# Patient Record
Sex: Female | Born: 1985 | Race: White | Hispanic: No | Marital: Single | Smoking: Never smoker
Health system: Southern US, Community
[De-identification: ages and names within clinical notes are randomized; demographics above are authoritative.]

## PROBLEM LIST (undated history)

## (undated) DIAGNOSIS — F329 Major depressive disorder, single episode, unspecified: Secondary | ICD-10-CM

## (undated) DIAGNOSIS — T7840XA Allergy, unspecified, initial encounter: Secondary | ICD-10-CM

## (undated) DIAGNOSIS — F32A Depression, unspecified: Secondary | ICD-10-CM

## (undated) DIAGNOSIS — J45909 Unspecified asthma, uncomplicated: Secondary | ICD-10-CM

## (undated) DIAGNOSIS — F419 Anxiety disorder, unspecified: Secondary | ICD-10-CM

## (undated) DIAGNOSIS — F319 Bipolar disorder, unspecified: Secondary | ICD-10-CM

## (undated) HISTORY — DX: Major depressive disorder, single episode, unspecified: F32.9

## (undated) HISTORY — DX: Anxiety disorder, unspecified: F41.9

## (undated) HISTORY — DX: Allergy, unspecified, initial encounter: T78.40XA

## (undated) HISTORY — DX: Depression, unspecified: F32.A

## (undated) HISTORY — DX: Unspecified asthma, uncomplicated: J45.909

## (undated) HISTORY — DX: Bipolar disorder, unspecified: F31.9

## (undated) HISTORY — PX: APPENDECTOMY: SHX54

---
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
## 2005-09-12 ENCOUNTER — Encounter: Admission: RE | Admit: 2005-09-12 | Discharge: 2005-09-12 | Payer: Self-pay | Admitting: *Deleted

## 2007-08-24 ENCOUNTER — Inpatient Hospital Stay (HOSPITAL_COMMUNITY): Admission: EM | Admit: 2007-08-24 | Discharge: 2007-08-25 | Payer: Self-pay | Admitting: Emergency Medicine

## 2007-08-24 ENCOUNTER — Encounter (INDEPENDENT_AMBULATORY_CARE_PROVIDER_SITE_OTHER): Payer: Self-pay | Admitting: Surgery

## 2008-06-21 IMAGING — CT CT ABDOMEN W/ CM
3 of 5 series · 14 of 32 positions shown, 19 images · IV contrast (OMNI 300/WATER & 100 ML OMNI 300)
Comparison: none

HISTORY: Right lower quadrant pain, nausea

[Series 2: routine abdomen · axial · 0.79mm/px · z∈[-322,-182]mm · 2 of 85 slices shown]
[im 29/85  soft-tissue]
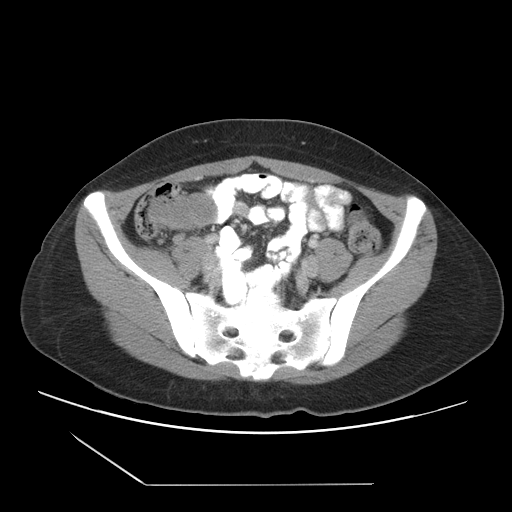
[im 57/85  soft-tissue]
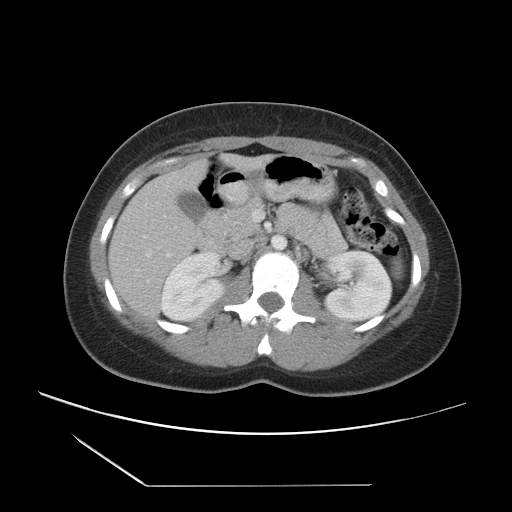

[Series 3: recon 2: routine abdomen · axial · 0.79mm/px · z∈[-396,-132]mm · 4 of 89 slices shown, 9 images]
[im 18/89  soft-tissue]
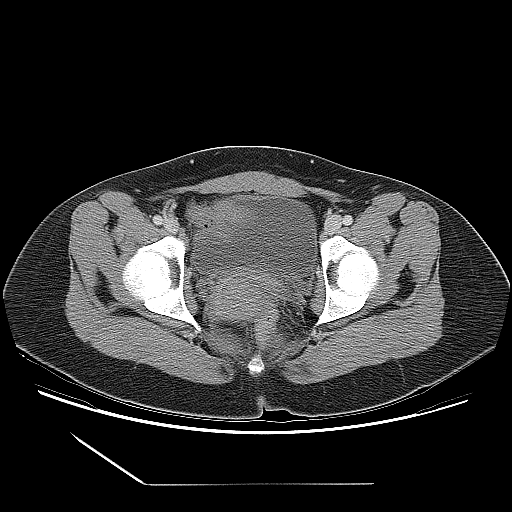
[im 18/89  lung]
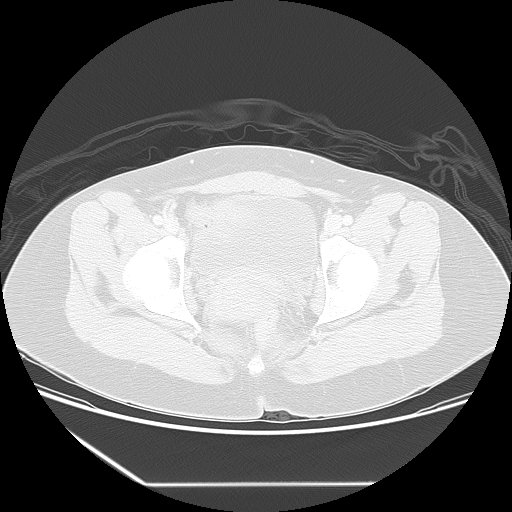
[im 18/89  bone]
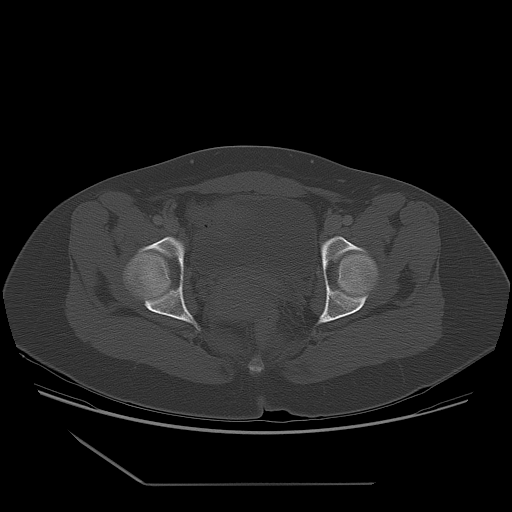
[im 36/89  soft-tissue]
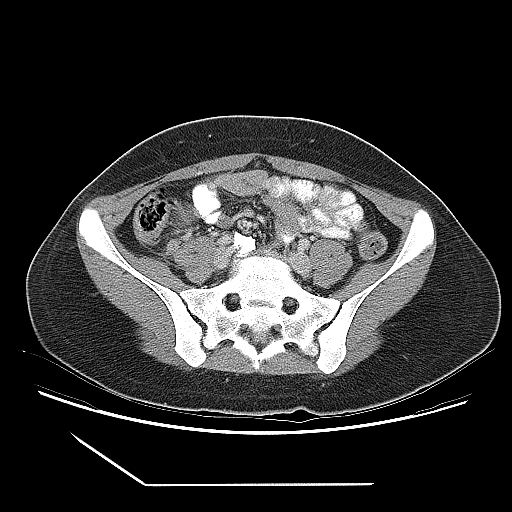
[im 36/89  lung]
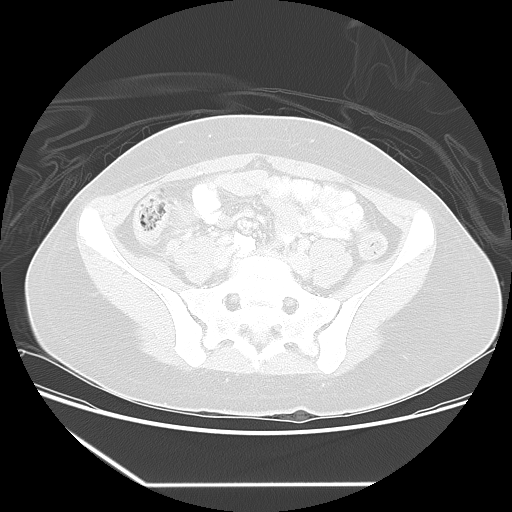
[im 53/89  soft-tissue]
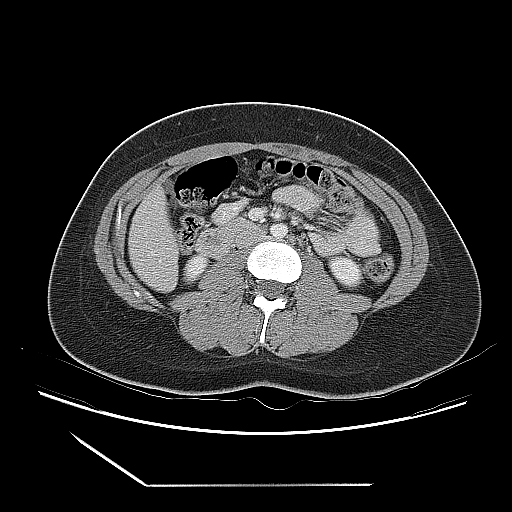
[im 53/89  lung]
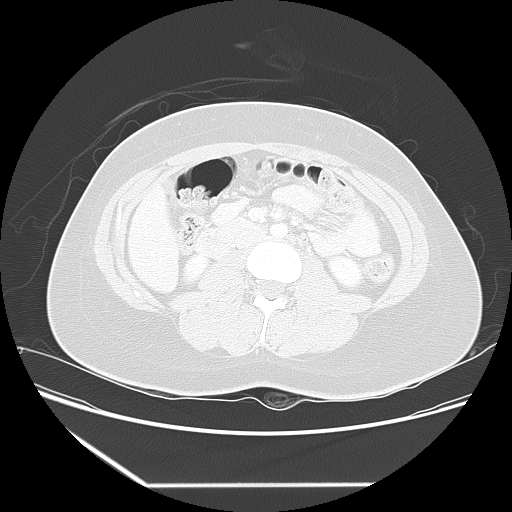
[im 71/89  soft-tissue]
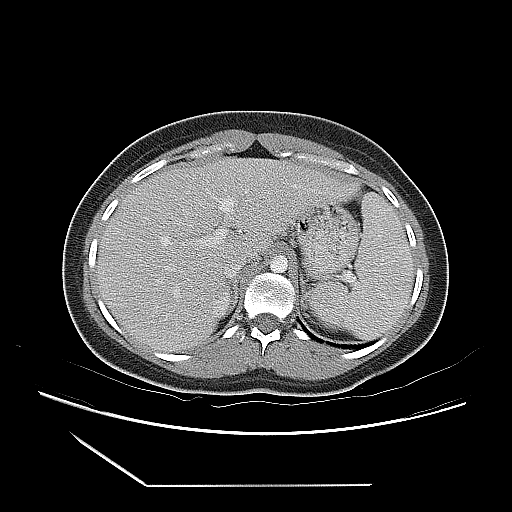
[im 71/89  lung]
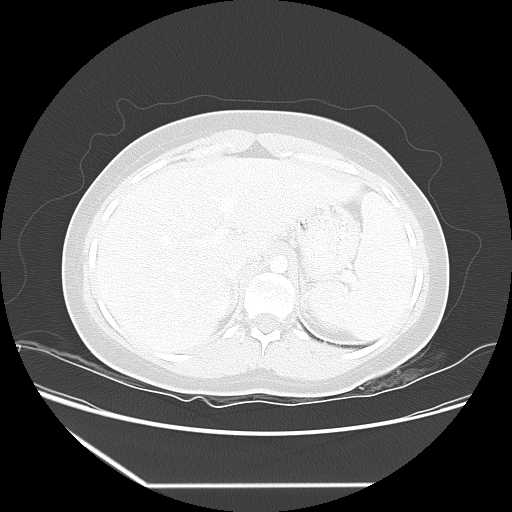

[Series 103: reformatted · sagittal · 0.88mm/px · 8 of 185 slices shown]
[im 17/185  soft-tissue]
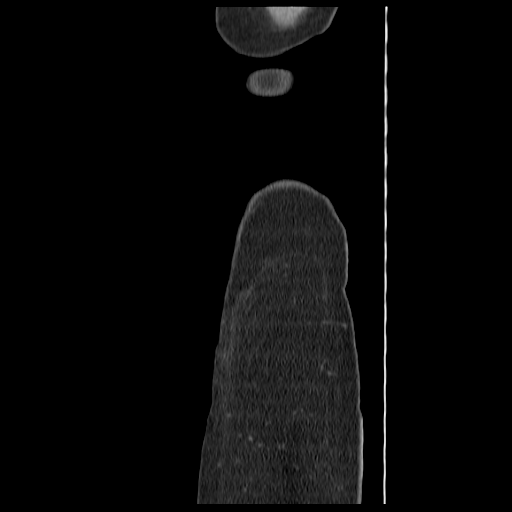
[im 34/185  soft-tissue]
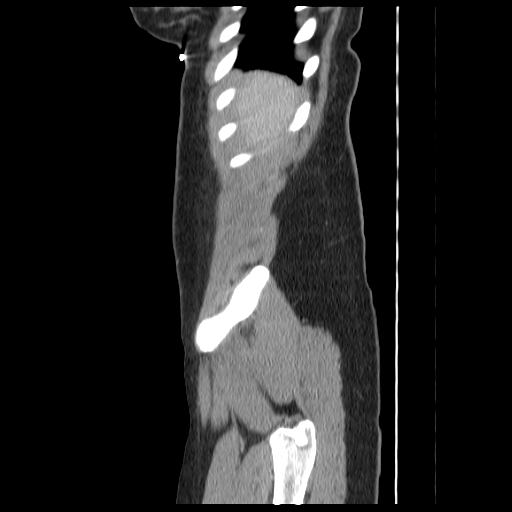
[im 67/185  soft-tissue]
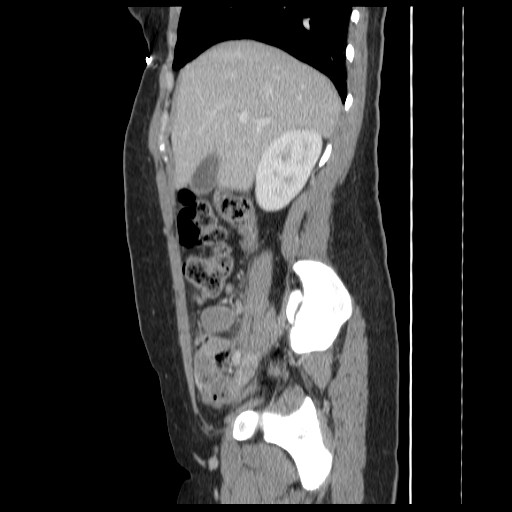
[im 84/185  soft-tissue]
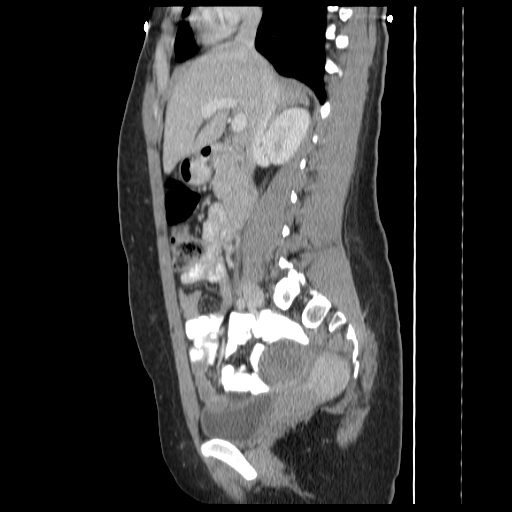
[im 101/185  soft-tissue]
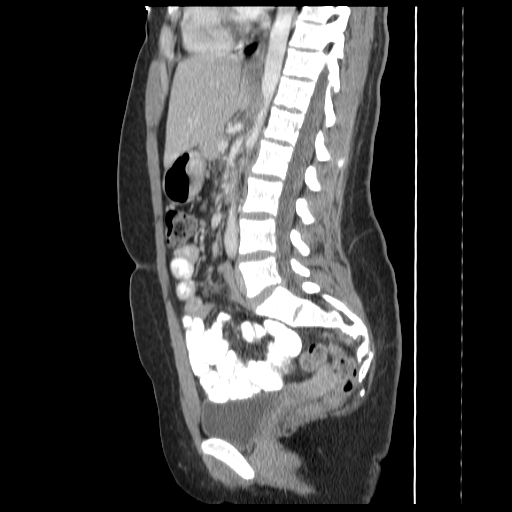
[im 118/185  soft-tissue]
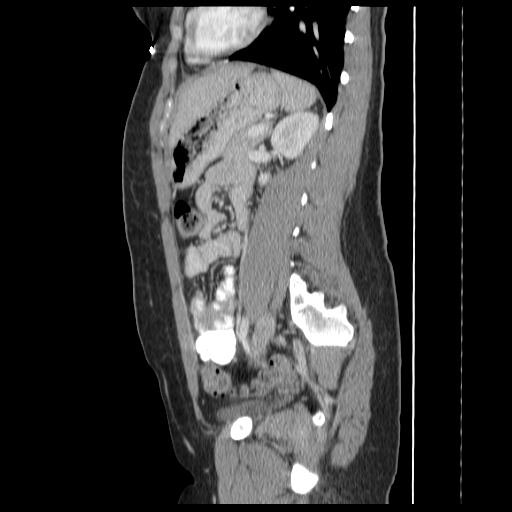
[im 151/185  soft-tissue]
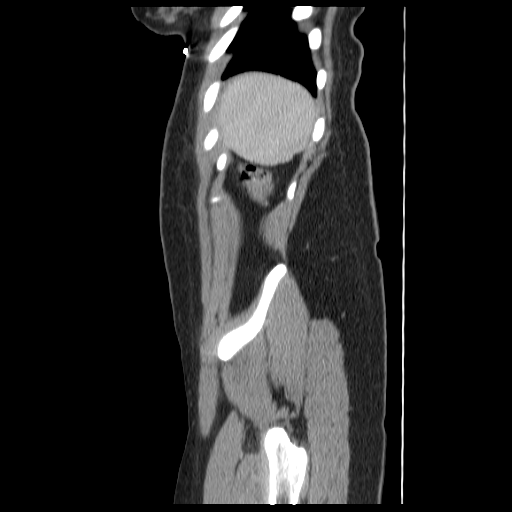
[im 168/185  soft-tissue]
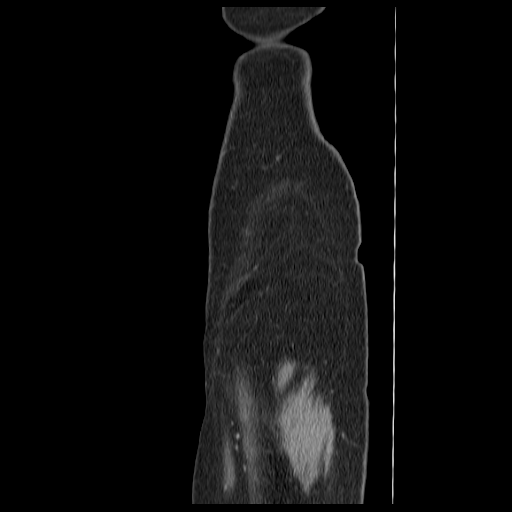

[14 of 32 positions shown; findings below may reference images not displayed]

CT ABDOMEN AND PELVIS WITH CONTRAST:

Multidetector helical CT imaging abdomen and pelvis performed.
Sagittal and coronal images are reconstructed from the axial data set.
Exam utilized dilute oral contrast and 100 cc [id number]-1II.
No prior exam for comparison. 

CT ABDOMEN:

Lung bases clear.
Liver, spleen, pancreas, kidneys, and adrenal glands normal.
Scattered normal size mesenteric nodes.
Borderline enlarged left para-aortic node 14 x 10 mm image 40.
No upper abdominal mass, free fluid, or inflammatory process.
IMPRESSION: Single nonspecific borderline enlarged left para-aortic lymph node.
No acute upper abdominal abnormalities, see below.

CT PELVIS:

Enlarged appendix with thickened wall and periappendiceal inflammatory changes.
Findings compatible with acute appendicitis.
Few adjacent reactive nodes within mesentery of right colon.
Additionally, 5.0 x 4.6 x 4.6 cm diameter cyst identified anterior to uterus,
likely from right ovary.
Left ovary normal size and morphology.
Unremarkable uterus and bladder.
Large and small bowel loops in pelvis otherwise normal.
No evidence of abscess, free fluid, or free air.
IMPRESSION: Acute appendicitis.
5 cm diameter right ovarian cyst, consider routine followup ultrasound of pelvis
when patient's condition permits, in order to characterize this lesion.

## 2009-04-27 ENCOUNTER — Emergency Department: Payer: Self-pay | Admitting: Emergency Medicine

## 2011-02-11 NOTE — Consult Note (Signed)
[name] [name], [name] [name]                 ACCOUNT NO.:  [id number]   MEDICAL RECORD NO.:  [id number]          PATIENT TYPE:  INP   LOCATION:  5731                         FACILITY:  [facility]   PHYSICIAN:  Maisie Fus A. Cornett, M.D.DATE OF BIRTH:  02/02/1986   DATE OF CONSULTATION:  08/24/2007  DATE OF DISCHARGE:                                 CONSULTATION   PHYSICIAN REQUESTING CONSULTATION:  Dr. Buren Kos.   REASON FOR CONSULTATION:  Abdominal pain.   HISTORY OF PRESENT ILLNESS:  The patient is a 25 year old female with a  1-day history of right lower quadrant pain.  The pain is sharp in nature  with radiation through to her back.  It is not associated with nausea or  vomiting.  It is made worse with movement.  The pain is a 10/10.  There  were no other associated symptoms such as blood in stool or diarrhea, no  history of viral illness exposure.  I was asked to see the patient at  the request of Dr. Clelia Croft in consultation for this.   PAST MEDICAL HISTORY:  1. Bipolar disease.  2. Depression.  3. Allergies.   MEDICATIONS:  Include Allegra, Zoloft, Singulair, Wellbutrin, Lamictal  and clonazepam.   PAST SURGICAL HISTORY:  Ankle surgeries.   FAMILY HISTORY:  Noncontributory except for a history of depression in  her mother.   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  Occasional tobacco  use.  No history of alcohol use.   REVIEW OF SYSTEMS:  Review of systems x15 systems otherwise negative  except that stated above.   PHYSICAL EXAMINATION:  VITAL SIGNS:  Temperature 98, pulse 120, blood  pressure 122/62.  GENERAL APPEARANCE:  White female in no apparent distress.  HEENT:  Extraocular movements are intact.  Oropharynx clear.  NECK:  Supple and nontender.  Trachea midline.  CHEST:  Clear to auscultation.  Chest wall motion normal.  CARDIOVASCULAR:  Regular rhythm without rub, murmur or gallop.  EXTREMITIES:  Warm and well-perfused.  ABDOMEN:  Positive rebound and  guarding in right lower quadrant with  tenderness.  No obvious mass.  No hernia.  EXTREMITIES:  Muscle tone  normal and range of motion normal.  NEUROLOGICAL:  Motor and sensory function are grossly intact.  Glasgow  coma scale is 15.  SKIN:  No evidence of rash or other suspicious lesion.  PSYCHIATRIC:  Her mood is appropriate.  She is cooperative and pleasant.   IMAGING STUDY:  Abdominopelvic CT reveals acute appendicitis with a 5-cm  right ovarian cyst.   LABORATORY DATA:  Her white count is 12,100.  Serum pregnancy test is  negative.  Hemoglobin is 13.9.  Urinalysis within normal limits.  Her  CMET is pending.   IMPRESSION:  Acute appendicitis.   PLAN:  I have recommended a laparoscopic appendectomy to her.  I have  discussed this with the patient and mother to include complications of  bleeding, infection and abscess formation.  There is also a risk of  having to convert to an open procedure.  As for the ovarian cyst, this  can be managed as an outpatient by her gynecologist; I do not feel it  needs any further intervention, given her acute appendicitis.  She  understands the above risks of surgery and they agree to proceed.      [name], M.D.  Electronically Signed     TAC/MEDQ  D:  08/24/2007  T:  08/25/2007  Job:  [id number]   cc:   Kari Baars, M.D.

## 2011-02-11 NOTE — Discharge Summary (Signed)
[name], GOODSON                 ACCOUNT NO.:  [id number]   MEDICAL RECORD NO.:  [id number]          PATIENT TYPE:  INP   LOCATION:  5731                         FACILITY:  [facility]   PHYSICIAN:  Ollen Gross. Vernell Morgans, M.D. DATE OF BIRTH:  [birth year]   DATE OF ADMISSION:  08/24/2007  DATE OF DISCHARGE:  08/25/2007                               DISCHARGE SUMMARY   ADMITTING PHYSICIAN:  Maisie Fus A. Cornett, M.D.   DISCHARGING PHYSICIAN:  Ollen Gross. Vernell Morgans, M.D.   CHIEF COMPLAINT:  [name] is a 21-year female patient with 24-hour  history of right lower quadrant pain, sharp in nature, radiating to  back.  The pain was 10/10. The patient has a past medical history of  asthma. On clinical exam, she had rebounding and guarding in the right  lower quadrant and exquisite tenderness consistent with acute  appendicitis.  Her CT revealed acute appendicitis and a 5 cm right  ovarian cyst.  The patient was admitted by Dr. Luisa Hart with a diagnosis  of acute appendicitis, noting that her white count was 12,100.   HOSPITAL COURSE:  The patient was taken from the ER to the OR where she  underwent a laparoscopic appendectomy without incident and was sent back  to the regular floor to recover.   Postop day #1, the patient was afebrile; vital signs were stable, but  she was still requiring nasal cannula oxygen and complaining of  shortness of breath at rest and the sensation of inability to get an  good breath. On exam, her lungs were clear but diminished posteriorly.  She was somewhat tachycardic with activity.  It was felt she was having  a mild asthma exacerbation, so she was given nebulizer treatments. The  patient also does not normally check peak flow readings at home, so a  baseline peak flow was obtained, and respiratory therapy taught the  patient how to perform these.  The patient's respiratory symptoms  improved with nebulizer therapy.  In addition, the Toradol was added to  help maximize pain  control.  The patient states and her mother states as  well that she is able to tolerate NSAIDs at home without any asthma  exacerbation symptoms.  It was felt that if patient's respiratory status  improved, later on the day she could be discharged home.   On August 25, 2007, which was postoperative day #1, the patient's  respiratory status did improve. Note that her peak flow was 120 before  medical treatment and 230 post.  By the second respiratory treatment, it  was 230 and 260 post; so she was 50% predicted pre and 56% predicted  post. Her estimated peak flow should be 460. The patient was given this  number and encouraged to utilize her peak flow on a regular basis so she  could possibly determine any potential early active asthma exacerbation.  By date of discharge, she was tolerating room air without any shortness  of breath, only mild tachycardia and was saturating 98%. From a clinical  standpoint from surgery, her abdomen was stable.  Incisions were clean,  dry and intact, and she was tolerating a solid diet and oral pain  medications prior to discharge.   DISCHARGE DIAGNOSES:  1. Acute nonperforated appendicitis status post laparoscopic      appendectomy.  2. Very large right ovarian cyst.  3. Asthma.  4. History of depression and anxiety.   DISCHARGE MEDICATIONS:  1. Zoloft 150 mg daily.  2. Allegra 180 mg daily.  3. Wellbutrin 300 mg daily.  4. Lamictal 200 mg daily.  5. Singulair 10 mg daily.  6. New medications include albuterol metered-dose inhaler 2 puffs      every 4 hours as needed for shortness of breath.  7. Vicodin 5/325 one to two tablets every 4 hours as needed for pain.  8. Ibuprofen 600 mg one every 8 hours as needed for pain.  May take in      addition of Vicodin. Take with food or snack.   OTHER DISCHARGE INSTRUCTIONS:  No restrictions in diet.  Return to  school in 1 week for exams; a note has been given.   ACTIVITY:  May shower for the next 1  week, then may tub bathe.  No  lifting for 1 week greater 15 pounds. No driving while taking Vicodin.   WOUND CARE:  Remove any bandages in the morning. If you have Steri-  Strips in place, allow them to curl and off.   OTHER INSTRUCTIONS:  Again, continue with weekly peak flow readings and  keep a record and check peak flow when short of breath as well. Notify  primary care physician if note a decline in peak flow readings.  Your  normal predicted baseline for peak flow should be at least 460.   FOLLOWUP:  1. You are to call Dr. Luisa Hart to be seen in 2 weeks.  2. You are to follow up with your gynecologist for a very large      ovarian cyst for further evaluation.  3. Other instructions:  You are to call Dr. Rosezena Sensor office should      you have fever greater than 101 degrees Fahrenheit, new or      increased belly pain, nausea, vomiting, diarrhea or redness or      drainage from the wound.      Allison L. Rennis Harding, N.POllen Gross. Vernell Morgans, M.D.  Electronically Signed    ALE/MEDQ  D:  09/27/2007  T:  09/27/2007  Job:  [id number]   cc:   Maisie Fus A. Cornett, M.D.  Dr. Clelia Croft

## 2011-02-11 NOTE — Op Note (Signed)
[name] [name], [name] [name]                 ACCOUNT NO.:  [id number]   MEDICAL RECORD NO.:  [id number]          PATIENT TYPE:  INP   LOCATION:  5731                         FACILITY:  [facility]   PHYSICIAN:  Maisie Fus A. Cornett, M.D.DATE OF BIRTH:  Jun 02, 1986   DATE OF PROCEDURE:  08/24/2007  DATE OF DISCHARGE:                               OPERATIVE REPORT   PREOPERATIVE DIAGNOSIS:  Acute appendicitis.   POSTOPERATIVE DIAGNOSES:  1. Acute appendicitis.  2. Right ovarian cyst measuring 5 cm.   PROCEDURE:  Laparoscopic appendectomy.   SURGEON:  Harriette Bouillon, M.D.   ANESTHESIA:  General endotracheal anesthesia with 0.25% Sensorcaine with  epinephrine.   ESTIMATED BLOOD LOSS:  30 mL.   SPECIMEN:  Appendix to pathology.   INDICATIONS FOR PROCEDURE:  The patient is a 25 year old female found to  have acute appendicitis on workup tonight in the emergency room after  being sent by Dr. Buren Kos for abdominal pain.  Of note, she had a  very large right ovarian cyst, also found on CT.  She was brought to the  operating room for acute appendicitis and laparoscopic appendectomy  after discussion of the above with the patient her mother.  Informed  consent was obtained.   DESCRIPTION OF PROCEDURE:  The patient was brought to the operating room  and placed supine.  After induction of general anesthesia, a Foley  catheter was placed, and the left arm was tucked.  The abdomen was then  prepped and draped in a sterile fashion.  A 1-cm supraumbilical incision  was made.  Dissection was carried down to her fascia, and her fascia was  opened with a scalpel blade.  I used a hemostat to open the peritoneal  lining and entered the abdominal cavity under direct vision.  Pursestring suture of 0 Vicryl was placed, and a 12-mm Hassan cannula  was placed under direct vision.  Pneumoperitoneum was created to 15 mmHg  of CO2, and a laparoscope was placed.  Upon inspection, the appendix was  retrocecal.  She  had a 5-cm right ovarian cyst noted, and pictures were  taken.  Left ovary appeared grossly normal.  Two 5 mL ports were placed,  one in between the pubic symphysis and the umbilicus, a second in the  left lower quadrant, both under direct vision.  The right colon was then  rolled as the patient was rolled to her left and placed in  Trendelenburg.  I used a grasper to roll the colon toward the patient's  left.  The appendix was posterior to this in a retrocecal position and  extended all way up to almost the gallbladder fossa.  Harmonic scalpel  was used to mobilize the appendix off the backside of the colon and out  of the retroperitoneum where it was located.  We fully mobilized the  entire appendix, and I was able to take the mesoappendix down with  harmonic scalpel.  A GIA 35 stapling device was then placed across the  base of the appendix and the cecum and fired.  The stump was hemostatic.  Appendage was placed in  an EndoCatch bag and extracted.  I inspected the  retroperitoneum where I had to mobilize the appendix and this was  hemostatic.  I placed small pieces of Surgicel in this area for  additional hemostasis.  Irrigation was used and suctioned out.  The  appendiceal stump was closed and was not bleeding.  Small piece of  Surgicel was placed in this.  I then replaced the right colon to the  right lateral wall manually.  No evidence of colonic injury upon  inspection was noted.  At this point in time, I reinspected the  abdominal cavity, saw no signs of bowel injury or colonic injury, and  hemostasis was excellent.  Excess irrigation was suctioned out.  The  patient was then rolled flat.  Gallbladder, liver, stomach, small and  large bowel otherwise appeared grossly normal.  The right ovarian cyst  appeared to be a simple cyst.  At this point in time, I removed my ports  and allowed the CO2 to escape with no signs of port site bleeding.  Umbilical fascia was closed with  pursestring suture of 0 Vicryl, and 4-0  Monocryl was used to close the skin incisions.  Dermabond was placed as  a dressing.  All final counts of sponge, needle and instruments were  found be correct.  Foley catheter was removed.  She was then extubated,  awoke and taken to recovery in satisfactory condition.      [name], M.D.  Electronically Signed     TAC/MEDQ  D:  08/24/2007  T:  08/25/2007  Job:  [id number]   cc:   Kari Baars, M.D.

## 2011-02-14 NOTE — Op Note (Signed)
NAME:  [name] [name], [name] [name]                      ACCOUNT NO.:  [id number]   MEDICAL RECORD NO.:  [id number]                   PATIENT TYPE:  AMB   LOCATION:  DSC                                  FACILITY:  [facility]   PHYSICIAN:  Brantley Persons, M.D.             DATE OF BIRTH:  [birth year]   DATE OF PROCEDURE:  07/13/2003  DATE OF DISCHARGE:                                 OPERATIVE REPORT   PREOPERATIVE DIAGNOSIS:  Recurrent suspicious skin lesion, right lower  lateral leg.   POSTOPERATIVE DIAGNOSIS:  Recurrent suspicious skin lesion, right lower  lateral leg.   PROCEDURE:  Excision of 1.1 cm recurrent suspicious skin lesion, right  lateral lower leg.   ATTENDING SURGEON:  Brantley Persons, M.D.   ANESTHESIA:  1% lidocaine with epinephrine.   COMPLICATIONS:  None.   INDICATIONS FOR PROCEDURE:  The patient is a 25 year old Caucasian female  who had a skin lesion previously removed by shave biopsy in the summer of  2003.  However, it has recurred and has a scab over it that does not heal  over.  She therefore presents to undergo re-excision of this lesion.   DESCRIPTION OF PROCEDURE:  The patient was brought into the procedure room,  placed on the table in the supine position.  The right lower lateral leg was  prepped with Betadine and alcohol and draped in a sterile fashion.  The skin  and subcutaneous tissues in the area of the skin lesion were then injected  with 1% lidocaine with epinephrine.  After adequate hemostasis anesthesia  had taken effect, the procedure was begun.  Using loupe magnification, the  borders of the skin lesion were identified.  At least 1 mm of skin margins  were then marked circumferentially around this skin lesion.  The skin lesion  was thus excised full-thickness through the skin into the subcutaneous  tissues, marked at the 12 o'clock position with a suture and passed off the  table to undergo permanent pathologic section evaluation.  The skin  edges  were then undermined for easier closure.  The wound was closed using 3-0  Monocryl in the dermal layer followed by a 3-0 Monocryl running  intracuticular stitch on the skin.  The incision was dressed with Steri-  Strips.  There were no complications.  The patient tolerated the procedure  well.  The patient and her mother were then given proper postoperative wound  care instructions.  The patient was discharged home in the care of her  mother in stable condition.  Follow-up appointment will be tomorrow in the  office.                                                Brantley Persons, M.D.    MC/MEDQ  D:  07/13/2003  T:  07/14/2003  Job:  [id number]

## 2011-02-14 NOTE — H&P (Signed)
NAME:  [name] [name], [name] [name]                      ACCOUNT NO.:  [id number]   MEDICAL RECORD NO.:  [id number]                   PATIENT TYPE:  INP   LOCATION:  0105                                 FACILITY:  [facility]   PHYSICIAN:  Cindie Crumbly, M.D.               DATE OF BIRTH:  04/23/1986   DATE OF ADMISSION:  08/07/2002  DATE OF DISCHARGE:                         PSYCHIATRIC ADMISSION ASSESSMENT   PATIENT IDENTIFICATION:  This 25 year old white female was admitted  complaining of depression with suicidal ideation with a plan she refused to  discuss.  She was unable to contract for safety.   HISTORY OF PRESENT ILLNESS:  For the past one to two weeks, the patient  reports that she has been seeing people in [place] costumes that have  watching her, plotting against her, and plan to abduct her and harm her.  She reports that these hallucinations had been worse over the past 48 hours  prior to admission, that she was unable to control the hallucinations, and  they were not getting better.  She reports that stuffed animals and dolls  and pictures on her walls were coming alive and frightening her.  She  complains of an depressed, irritable, and angry mood most of the day nearly  every day, psychomotor agitation, feelings of hopelessness, helplessness,  and worthlessness, decreased concentration and energy level, increased  symptoms of fatigue, insomnia, decreased appetite, weight loss of 15 pounds.  She admits to nightmares.  Mother brought the patient to the hospital on the  evening of admission saying that she could not keep the patient safe and  this morning, is requesting that the patient be discharged, stating that she  has changed her mind about the admission.  The patient's current  psychosocial stressors include the fact that she has stayed up several  night, as she has been performing in a play.  Mother has a history of mental  illness and alcoholism.  Stepfather moved out of the  household three weeks  ago and the patient reports that the mother has been actively drinking at  least several days per week to the point of getting drunk.  According to Dr.  Nolen Mu, the patient has been caring for her mother at times because of  mother's alcohol use.   PAST PSYCHIATRIC HISTORY:  The patient's past psychiatric history is  significant for her being following in outpatient treatment by Andee Poles, M.D.  The patient has no previous history of inpatient treatment.   SUBSTANCE ABUSE HISTORY:  The patient denies any use of alcohol, tobacco, or  street drugs.   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   ALLERGIES:  The patient has no known drug allergies or sensitivities.   CURRENT MEDICATIONS:  1. Singulair 10 mg p.o. q.d.  2. Ambien 5-10 mg  p.o. q.h.s.  3. Zoloft 100 mg p.o. q.a.m.   FAMILY AND SOCIAL HISTORY:  The patient lives with her mother.  The patient  reports that mother has been getting drunk multiple days per week since  stepfather left three weeks ago.  The patient is currently in high school  and doing well.   MENTAL STATUS EXAM:  The patient presents as a well developed, well  nourished, adolescent white female who is alert and oriented x 4,  psychomotor agitated, and whose appearance is compatible with her stated  age.  Speech is coherent with a decreased rate and volume of speech,  increased speech latency.  She displays no looseness of associations,  phonemic errors, or evidence of a thought disorder.  Her thought processes  are presently goal directed.  Her affect and mood are depressed, irritable,  angry, and anxious.  Her concentration is decreased.  She displays poor  impulse control.  Her immediate recall, short-term memory, and remote memory  are intact.  Similarities and differences are within normal limits  and she  is able to abstract to proverbs.   ADMISSION DIAGNOSES:   AXIS I:  1. Major depression, recurrent, severe with mood congruent psychosis.  2. Rule out delirium secondary to sleep deprivation.  3. 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   AXIS II:  1. Rule out personality disorder, not otherwise specified.  2. Rule out learning disorder, not otherwise specified.   AXIS III:  Asthma.   AXIS IV:  Severe.   AXIS V:  20   ASSETS AND STRENGTHS:  Her mother is very supportive of her.   INITIAL PLAN OF CARE:  Initial plan of care is to continue the patient on  Zoloft.  Psychotherapy will focus on improving the patient's impulse  control, decreasing cognitive distortions, and improving her reality  testing.  A laboratory workup will also be initiated to rule out any medical  problems contributing to her symptomatology.  I have discussed the case at  length with the patient's mother and with Dr. Nolen Mu.   ESTIMATED LENGTH OF STAY:  The estimated length of stay for the patient on  the inpatient unit is three to five days.   POST HOSPITAL CARE PLAN:  Initial discharge plan is to discharge the patient  to home.                                               Cindie Crumbly, M.D.    TS/MEDQ  D:  08/08/2002  T:  08/08/2002  Job:  [id number]

## 2011-02-14 NOTE — Op Note (Signed)
NAME:  [name], [name]                      ACCOUNT NO.:  [id number]   MEDICAL RECORD NO.:  [id number]                   PATIENT TYPE:  AMB   LOCATION:  DSC                                  FACILITY:  [facility]   PHYSICIAN:  Brantley Persons, M.D.             DATE OF BIRTH:  03-Apr-1986   DATE OF PROCEDURE:  10/17/2002  DATE OF DISCHARGE:  10/17/2002                                 OPERATIVE REPORT   PREOPERATIVE DIAGNOSIS:  Dysplastic nevi of back.   POSTOPERATIVE DIAGNOSIS:  Dysplastic nevi of back.   OPERATION PERFORMED:  1. Excision of 1.3 cm dysplastic right scapular area.  2. Complex closure of 2.5 cm incision right scapular area.  3. Excision of 1.2 cm dysplastic nevus left scapular area.  4. Complex closure of 2.3 cm left scapular incision.   SURGEON:  Brantley Persons, M.D.   ANESTHESIA:  1% lidocaine with epinephrine.   COMPLICATIONS:  None.   INDICATIONS FOR PROCEDURE:  The patient is a 25 year old Caucasian female  with biopsy proven dysplastic nevi.  She presents to undergo excision of  these lesions with proper surgical margins.   DESCRIPTION OF PROCEDURE:  The patient was brought to the minor room and  placed on the table in the prone position.  The back was prepped with  Betadine and draped in sterile fashion.  The skin and subcutaneous tissues  in the areas of the dysplastic nevi were then injected with 1% lidocaine  with epinephrine.  After adequate hemostasis and anesthesia had taken  effect, the procedure was begun.  Using loupe magnification, 1 to 2 mm  borders were marked around the healing biopsy site and residual nevus in the  right scapular area.  These margins were then incised and the skin lesion  was excised full thickness through the skin into the subcutaneous tissue.  The specimen was marked at 12 o'clock position with a suture and passed off  the table to undergo permanent pathologic section evaluation.  I then  proceeded with a complex  closure of the incision.  The wound edges were  slightly undermined for easier  closure.  The superficial layer was closed  using 2-0 Monocryl suture.  The deeper subcutaneous tissues were also closed  using 2-0 Monocryl suture.  The dermal layer was then closed with a 3-0  Monocryl suture.  The skin was then closed with a 3-0 Monocryl in a running  intracuticular stitch.  Attention was then turned to the left scapular skin  lesion.  Using loupe magnification, 1 to 2 mm margins were marked  circumferentially around the healing biopsy sites and residual nevus.  The  nevus was thus excised with these surgical margins.  The specimen went  through the skin into the subcutaneous tissue.  The specimen was marked at  the 12 o'clock position and passed off the table to undergo permanent  pathologic section evaluation.  The skin edges have been undermined for  closure.  The wound was then closed in complex fashion.  The superficial  fascial layer was closed using 2-0 Monocryl suture.  The subcutaneous  tissues were also closed using 2-0 Monocryl suture.  The dermal layer was  then closed with 3-0 Monocryl interrupted sutures.  The skin was then closed  with a 3-0 Monocryl running intracuticular stitch on the skin.  Both  incisions were then dressed with Benzoin and Steri-Strips.  There were no  complications.  The patient tolerated the procedure well.  She was then  taught proper postoperative wound care instructions as well as her  mother was also taught these instructions.  She was then discharged home in  the care of her mother in stable condition.  Follow-up appointment will be  tomorrow in the office for a wound check.                                                Brantley Persons, M.D.    MC/MEDQ  D:  10/18/2002  T:  10/19/2002  Job:  [id number]

## 2011-02-14 NOTE — Discharge Summary (Signed)
NAME:  [name] [name], [name] [name]                      ACCOUNT NO.:  [id number]   MEDICAL RECORD NO.:  [id number]                   PATIENT TYPE:  INP   LOCATION:  0105                                 FACILITY:  [facility]   PHYSICIAN:  Cindie Crumbly, M.D.               DATE OF BIRTH:  1986-04-17   DATE OF ADMISSION:  08/07/2002  DATE OF DISCHARGE:                                 DISCHARGE SUMMARY   REASON FOR ADMISSION:  This 25 year old white female was admitted  complaining of depression with suicidal ideation with a plan she refused to  discuss.  She was unable to contract for safety.  She also had been  experiencing increasing auditory and visual hallucinations and persecutory  delusions.  She also had been reporting not sleeping for a period of several  days.  For further history of present illness, please see the patient's  psychiatry admission assessment.   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  She also had a history of asthma for which she was well  controlled on Singulair.  Her physical examination was otherwise  unremarkable.   LABORATORY EXAMINATION:  The patient underwent a laboratory workup to rule  out any medical problems contributing to her symptomatology.  A TSH and free  T4 were within normal limits.  An RPR was nonreactive.  A hepatic panel was  within normal limits.  GGT was within normal limits.  CBC showed an  eosinophil count of 6% and was otherwise unremarkable.  Basic metabolic  panel was within normal limits.  A urine probe for gonorrhea and chlamydia  is pending at the time of discharge.  The patient received no x-rays, no  special procedures, no additional consultations.  She sustained no  complications during the course of this hospitalization.   HOSPITAL COURSE:  On admission, the patient showed a rapid clearing of her  psychotic  symptoms once she got to sleep on the unit.  She was continued on  her admission medications which have been Zoloft 100 mg p.o. q.a.m. and  Ambien 5-10 mg p.o. q.h.s. p.r.n. for insomnia.  She has tolerated these  well, without any side effects.  At the time of discharge, she denies any  homicidal or suicidal ideation.  Her affect and mood are less depressed.  Her concentration has increased.  She is no longer displaying any evidence  of a thought disorder.  She has been participating actively in all aspects  of the therapeutic treatment program, denies any plans to harm herself and  others, is motivated for outpatient therapy,  and consequently is felt to  have reached her maximum benefits of hospitalization and is ready for  discharge to a less restricted alternative setting.   CONDITION ON DISCHARGE:  Improved.   DISCHARGE DIAGNOSES:   AXIS I:  1. Major depression, recurrent, severe with mood incongruent psychosis.  2. Delirium secondary to sleep deprivation.  3. Rule out generalized anxiety disorder.   AXIS II:  1. Rule out personality disorder not otherwise specified.  2. Histrionic traits.   AXIS III:  Asthma.   AXIS IV:  Severe.   AXIS V:  Code 20 on admission, code 30 on discharge.   FURTHER EVALUATION AND TREATMENT RECOMMENDATIONS:  1. The patient is discharged to home.  2. She is discharged on an unrestricted level of activity and a regular     diet.  3. She will follow up with Dr. Andee Poles, her outpatient psychiatrist,     for all further aspects of her psychiatric care and consequently I will     sign off on the case at this time.  She will follow up with her primary     care physician for all further aspects of her medical care.   DISCHARGE MEDICATIONS:  1. Zoloft 100 mg p.o. q.d.  2. Ambien 5-10 mg p.o. q.h.s.                                                 Cindie Crumbly, M.D.    TS/MEDQ  D:  08/09/2002  T:  08/09/2002  Job:  [id number]

## 2011-07-08 LAB — BASIC METABOLIC PANEL
BUN: 9
Chloride: 103
Creatinine, Ser: 0.67

## 2011-07-08 LAB — CBC
MCHC: 33.9
MCV: 85.6
Platelets: 287
WBC: 17.5 — ABNORMAL HIGH

## 2012-03-01 ENCOUNTER — Other Ambulatory Visit: Payer: Self-pay | Admitting: Dermatology

## 2013-11-03 ENCOUNTER — Ambulatory Visit (INDEPENDENT_AMBULATORY_CARE_PROVIDER_SITE_OTHER): Payer: BC Managed Care – PPO | Admitting: Family Medicine

## 2013-11-03 VITALS — BP 130/80 | HR 94 | Temp 97.9°F | Resp 16 | Ht 79.0 in | Wt 206.2 lb

## 2013-11-03 DIAGNOSIS — Z130 Encounter for screening for diseases of the blood and blood-forming organs and certain disorders involving the immune mechanism: Secondary | ICD-10-CM

## 2013-11-03 DIAGNOSIS — Z13228 Encounter for screening for other metabolic disorders: Secondary | ICD-10-CM

## 2013-11-03 DIAGNOSIS — Z23 Encounter for immunization: Secondary | ICD-10-CM

## 2013-11-03 DIAGNOSIS — Z7184 Encounter for health counseling related to travel: Secondary | ICD-10-CM

## 2013-11-03 DIAGNOSIS — Z1329 Encounter for screening for other suspected endocrine disorder: Secondary | ICD-10-CM

## 2013-11-03 MED ORDER — TYPHOID VACCINE PO CPDR: 1.0000 | DELAYED_RELEASE_CAPSULE | ORAL | Status: AC

## 2013-11-03 MED ORDER — CIPROFLOXACIN HCL 500 MG PO TABS: 500.0000 mg | ORAL_TABLET | Freq: Two times a day (BID) | ORAL | Status: AC

## 2013-11-03 NOTE — Progress Notes (Signed)
   Subjective:    Patient ID: [name] [name], female    DOB: July 09, 1986, 28 y.o.   MRN: [id number]  HPI This 28 y.o. female presents for evaluation of Immunization review.  Bipolar and going to therapy in [place].  Needs immunizations.    DTP completed Hep B completed Hib completed. MMR completed. Polio completed. Td completed; TDAP 2009.  +chicken pox disease age 24.  Hepatitis A#1 in 01/17/2004. Oral typhoid 01/17/2004. Meningococcal 2005. No Gardisil.  Leaves four days from now to [place].    No flu vaccine this season.  Psychiatry: Wynelle Fanny  Needs malaria prophylaxis and traveler's diarrhea rx.  Review of Systems  Constitutional: Negative for fever, chills, diaphoresis and fatigue.  Respiratory: Negative for shortness of breath.   Cardiovascular: Negative for chest pain.  Gastrointestinal: Negative for nausea, vomiting and diarrhea.  Skin: Negative for rash.   Past Medical History  Diagnosis Date  . Allergy   . Anxiety   . Asthma   . Depression   . Bipolar disorder    Past Surgical History  Procedure Laterality Date  . Appendectomy     No Known Allergies     Objective:   Physical Exam  Constitutional: She is oriented to [name], place, and time. She appears well-developed and well-nourished. No distress.  HENT:  Head: Normocephalic and atraumatic.  Eyes: Conjunctivae are normal. Pupils are equal, round, and reactive to light.  Neck: Normal range of motion. Neck supple.  Cardiovascular: Normal rate, regular rhythm and normal heart sounds.  Exam reveals no gallop and no friction rub.   No murmur heard. Pulmonary/Chest: Effort normal and breath sounds normal. She has no wheezes. She has no rales.  Neurological: She is alert and oriented to [name], place, and time.  Skin: She is not diaphoretic.  Psychiatric: She has a normal mood and affect. Her behavior is normal.  Nursing note and vitals reviewed.      Assessment & Plan:  Need for  prophylactic vaccination and inoculation against influenza  Need for prophylactic vaccination and inoculation against viral hepatitis  Travel advice encounter   1.  Travel advise encounter:  New. Rx for Typhoid vaccine oral provided; rx for Cipro for traveler's diarrhea.  S/p flu vaccine and Hepatitis A#2. 2. S/p influenza vaccine. 3.  S/p Hepatitis A#2.    Meds ordered this encounter  Medications  . lithium 600 MG capsule    Sig: Take 600 mg by mouth 2 (two) times daily with a meal.  . BuPROPion HCl (WELLBUTRIN PO)    Sig: Take by mouth.  Marland Kitchen omeprazole (PRILOSEC) 10 MG capsule    Sig: Take 10 mg by mouth daily.  . citalopram (CELEXA) 10 MG tablet    Sig: Take 10 mg by mouth daily.  . ValACYclovir HCl (VALTREX PO)    Sig: Take by mouth.  . Topiramate (TOPAMAX PO)    Sig: Take by mouth.  . typhoid (VIVOTIF) DR capsule    Sig: Take 1 capsule by mouth every other day.    Dispense:  4 capsule    Refill:  0  . ciprofloxacin (CIPRO) 500 MG tablet    Sig: Take 1 tablet (500 mg total) by mouth 2 (two) times daily.    Dispense:  14 tablet    Refill:  0    Reginia Forts, M.D.  [facility] [address] [place], [place]  [address] [phone] phone [phone] fax
